# Patient Record
Sex: Female | Born: 1955 | Race: White | Hispanic: No | Marital: Married | Smoking: Former smoker
Health system: Southern US, Community
[De-identification: ages and names within clinical notes are randomized; demographics above are authoritative.]

## PROBLEM LIST (undated history)

## (undated) DIAGNOSIS — M1990 Unspecified osteoarthritis, unspecified site: Secondary | ICD-10-CM

## (undated) DIAGNOSIS — Z973 Presence of spectacles and contact lenses: Secondary | ICD-10-CM

## (undated) DIAGNOSIS — G43909 Migraine, unspecified, not intractable, without status migrainosus: Secondary | ICD-10-CM

## (undated) DIAGNOSIS — Z9889 Other specified postprocedural states: Secondary | ICD-10-CM

## (undated) DIAGNOSIS — M419 Scoliosis, unspecified: Secondary | ICD-10-CM

## (undated) DIAGNOSIS — R112 Nausea with vomiting, unspecified: Secondary | ICD-10-CM

## (undated) DIAGNOSIS — E041 Nontoxic single thyroid nodule: Secondary | ICD-10-CM

## (undated) HISTORY — PX: COLONOSCOPY: SHX174

---

## 1997-09-05 HISTORY — PX: AUGMENTATION MAMMAPLASTY: SUR837

## 2005-02-10 ENCOUNTER — Ambulatory Visit: Payer: Self-pay

## 2013-02-15 ENCOUNTER — Ambulatory Visit: Payer: Self-pay | Admitting: Internal Medicine

## 2016-07-07 ENCOUNTER — Encounter: Payer: Self-pay | Admitting: Obstetrics and Gynecology

## 2016-09-09 DIAGNOSIS — M542 Cervicalgia: Secondary | ICD-10-CM | POA: Diagnosis not present

## 2017-01-18 ENCOUNTER — Other Ambulatory Visit: Payer: Self-pay | Admitting: Obstetrics & Gynecology

## 2017-01-18 DIAGNOSIS — Z1231 Encounter for screening mammogram for malignant neoplasm of breast: Secondary | ICD-10-CM

## 2017-03-02 ENCOUNTER — Ambulatory Visit
Admission: RE | Admit: 2017-03-02 | Discharge: 2017-03-02 | Disposition: A | Discharge status: Home / Self Care | Payer: 59 | Source: Ambulatory Visit | Attending: Obstetrics & Gynecology | Admitting: Obstetrics & Gynecology

## 2017-03-02 ENCOUNTER — Other Ambulatory Visit: Payer: Self-pay | Admitting: Obstetrics & Gynecology

## 2017-03-02 ENCOUNTER — Encounter: Payer: Self-pay | Admitting: Radiology

## 2017-03-02 DIAGNOSIS — Z1211 Encounter for screening for malignant neoplasm of colon: Secondary | ICD-10-CM | POA: Diagnosis not present

## 2017-03-02 DIAGNOSIS — R5383 Other fatigue: Secondary | ICD-10-CM | POA: Diagnosis not present

## 2017-03-02 DIAGNOSIS — F331 Major depressive disorder, recurrent, moderate: Secondary | ICD-10-CM | POA: Insufficient documentation

## 2017-03-02 DIAGNOSIS — Z1231 Encounter for screening mammogram for malignant neoplasm of breast: Secondary | ICD-10-CM

## 2017-03-02 DIAGNOSIS — Z01419 Encounter for gynecological examination (general) (routine) without abnormal findings: Secondary | ICD-10-CM | POA: Diagnosis not present

## 2017-03-07 ENCOUNTER — Other Ambulatory Visit: Payer: Self-pay | Admitting: *Deleted

## 2017-03-07 ENCOUNTER — Inpatient Hospital Stay
Admission: RE | Admit: 2017-03-07 | Discharge: 2017-03-07 | Disposition: A | Discharge status: Home / Self Care | Payer: Self-pay | Source: Ambulatory Visit | Attending: *Deleted | Admitting: *Deleted

## 2017-03-07 DIAGNOSIS — Z9289 Personal history of other medical treatment: Secondary | ICD-10-CM

## 2017-07-07 DIAGNOSIS — M542 Cervicalgia: Secondary | ICD-10-CM | POA: Diagnosis not present

## 2017-07-20 DIAGNOSIS — Z0000 Encounter for general adult medical examination without abnormal findings: Secondary | ICD-10-CM | POA: Diagnosis not present

## 2017-07-20 DIAGNOSIS — F419 Anxiety disorder, unspecified: Secondary | ICD-10-CM | POA: Insufficient documentation

## 2017-07-20 DIAGNOSIS — E7800 Pure hypercholesterolemia, unspecified: Secondary | ICD-10-CM | POA: Diagnosis not present

## 2017-07-20 DIAGNOSIS — E079 Disorder of thyroid, unspecified: Secondary | ICD-10-CM | POA: Insufficient documentation

## 2017-07-20 DIAGNOSIS — R51 Headache: Secondary | ICD-10-CM

## 2017-07-20 DIAGNOSIS — E785 Hyperlipidemia, unspecified: Secondary | ICD-10-CM | POA: Insufficient documentation

## 2017-07-20 DIAGNOSIS — R519 Headache, unspecified: Secondary | ICD-10-CM | POA: Insufficient documentation

## 2017-09-08 DIAGNOSIS — M539 Dorsopathy, unspecified: Secondary | ICD-10-CM | POA: Diagnosis not present

## 2017-09-08 DIAGNOSIS — R51 Headache: Secondary | ICD-10-CM | POA: Diagnosis not present

## 2017-09-15 DIAGNOSIS — M5030 Other cervical disc degeneration, unspecified cervical region: Secondary | ICD-10-CM | POA: Diagnosis not present

## 2017-09-15 DIAGNOSIS — M5416 Radiculopathy, lumbar region: Secondary | ICD-10-CM | POA: Diagnosis not present

## 2017-09-15 DIAGNOSIS — M62830 Muscle spasm of back: Secondary | ICD-10-CM | POA: Diagnosis not present

## 2017-09-28 DIAGNOSIS — D225 Melanocytic nevi of trunk: Secondary | ICD-10-CM | POA: Diagnosis not present

## 2017-09-28 DIAGNOSIS — L570 Actinic keratosis: Secondary | ICD-10-CM | POA: Diagnosis not present

## 2017-10-13 DIAGNOSIS — E079 Disorder of thyroid, unspecified: Secondary | ICD-10-CM | POA: Diagnosis not present

## 2017-11-02 ENCOUNTER — Ambulatory Visit: Payer: 59 | Attending: Family Medicine | Admitting: Physical Therapy

## 2017-11-02 ENCOUNTER — Encounter: Payer: Self-pay | Admitting: Physical Therapy

## 2017-11-02 ENCOUNTER — Other Ambulatory Visit: Payer: Self-pay

## 2017-11-02 DIAGNOSIS — M2560 Stiffness of unspecified joint, not elsewhere classified: Secondary | ICD-10-CM | POA: Diagnosis not present

## 2017-11-02 DIAGNOSIS — M6281 Muscle weakness (generalized): Secondary | ICD-10-CM

## 2017-11-02 DIAGNOSIS — R293 Abnormal posture: Secondary | ICD-10-CM | POA: Diagnosis present

## 2017-11-02 DIAGNOSIS — M5442 Lumbago with sciatica, left side: Secondary | ICD-10-CM | POA: Diagnosis not present

## 2017-11-02 NOTE — Therapy (Addendum)
[place] [facility] [address] [place], [place], [address] Phone: [phone]   Fax:  [phone]  Physical Therapy Evaluation  Patient Details  Name: Kendra George MRN: [id number] Date of Birth: June 15, 1956 Referring Provider: Allene Dillon, NP   Encounter Date: 11/02/2017  PT End of Session - 11/23/17 0735    Visit Number  1    Number of Visits  8    Date for PT Re-Evaluation  12/28/17    PT Start Time  1110    PT Stop Time  1206    PT Time Calculation (min)  56 min    Activity Tolerance  Patient tolerated treatment well    Behavior During Therapy  [facility] for tasks assessed/performed       History reviewed. No pertinent past medical history.  Past Surgical History:  Procedure Laterality Date  . AUGMENTATION MAMMAPLASTY Bilateral [place] IN 2010    There were no vitals filed for this visit.   Subjective Assessment - 11/23/17 0732    Subjective  Pt. 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  Pt. reports L sided radicular involvement and c/o "tingling".      Pertinent History  pt. exercises at [employer] in [place].  Pt. will occasionally take a 1/2 pain pill.,      Limitations  Sitting;Lifting;Standing;Walking;Other (comment)    Patient Stated Goals  Decrease LBP/ increase core strengthening.      Currently in Pain?  Yes    Pain Score  2     Pain Location  Back    Pain Orientation  Left;Lower    Pain Descriptors / Indicators  Tingling         See HEP    PT Education - 11/23/17 0735    Education provided  Yes    Education Details  See HEP    Person(s) Educated  Patient    Methods  Explanation;Demonstration;Handout    Comprehension  Verbalized understanding;Returned demonstration          PT Long Term Goals - 11/23/17 0745      PT LONG TERM GOAL #1   Title  Pt. will increase FOTO  from 70 to 79 to improve pain-free mobility.      Baseline  FOTO baseline 70    Time  8    Period  Weeks    Status  New    Target Date  12/28/17      PT LONG TERM GOAL #2   Title  Pt. independent with HEP to increase core strengthening/ stability to WNL to promote increase progression with gym based ex. program.      Baseline  limited understanding of core muscle activation.      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      Baseline  pain in L LBP with sitting R trunk rotn.      Time  8    Period  Weeks    Status  New    Target Date  12/28/17         Plan - 11/23/17 [phone]  Clinical Impression Statement  Pt. is a pleasant and active 62 y/o female with >3 month h/o L low back pain.  Pt. 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  Pt. tends to sit at work with R trunk rotn. and presents with mild scoliosis/ increase R scapular height/ increase R paraspinal muscle height as compared to L side.  No LLD.  Mild soft tissue tightness on R low thoracic/lumbar musculature as compared to L.  Pt. present with good B UE/LE muscle strength but requires cuing to properly contract TrA musculature.  FOTO: baseline 70/ goal 79.  Pt. will benefit from skilled PT with HEP focus to decrease L low back pain with work-related tasks.      Clinical Presentation  Stable    Clinical Decision Making  Low    Rehab Potential  Good    PT Frequency  1x / week    PT Duration  8 weeks    PT Treatment/Interventions  ADLs/Self Care Home Management;Cryotherapy;Electrical Stimulation;Moist Heat;Therapeutic activities;Therapeutic exercise;Neuromuscular re-education;Patient/family education;Manual techniques;Dry needling    PT Next Visit Plan  Progress core stability ex.  program.         Patient will benefit from skilled therapeutic intervention in order to improve the following deficits and impairments:  Decreased mobility, Hypomobility, Improper body mechanics, Decreased range of motion, Pain, Postural dysfunction, Decreased strength  Visit Diagnosis: Acute left-sided low back pain with left-sided sciatica  Joint stiffness  Muscle weakness (generalized)  Abnormal posture     Problem List There are no active problems to display for this patient.  Pura Spice, PT, DPT # [phone] 11/23/2017, 7:48 AM  [place] [facility] [address] [place], [place], [address] Phone: [phone]   Fax:  [phone]  Name: DEYSY SCHABEL MRN: [id number] Date of Birth: 1955-10-09

## 2017-11-09 ENCOUNTER — Ambulatory Visit: Payer: 59 | Attending: Family Medicine | Admitting: Physical Therapy

## 2017-11-09 DIAGNOSIS — M5442 Lumbago with sciatica, left side: Secondary | ICD-10-CM | POA: Diagnosis not present

## 2017-11-09 DIAGNOSIS — M2560 Stiffness of unspecified joint, not elsewhere classified: Secondary | ICD-10-CM

## 2017-11-09 DIAGNOSIS — R293 Abnormal posture: Secondary | ICD-10-CM

## 2017-11-09 DIAGNOSIS — M6281 Muscle weakness (generalized): Secondary | ICD-10-CM | POA: Diagnosis present

## 2017-11-16 ENCOUNTER — Ambulatory Visit: Payer: 59 | Admitting: Physical Therapy

## 2017-11-17 NOTE — Therapy (Addendum)
[place] [facility] [address] [place], [place], [address] Phone: [phone]   Fax:  [phone]  Physical Therapy Treatment  Patient Details  Name: Kendra George MRN: [id number] Date of Birth: [birth year] Referring Provider: Allene Dillon, NP   Encounter Date: 11/09/2017  PT End of Session - 11/23/17 0753    Visit Number  2    Number of Visits  8    Date for PT Re-Evaluation  12/28/17    PT Start Time  0903    PT Stop Time  1001    PT Time Calculation (min)  58 min    Activity Tolerance  Patient tolerated treatment well    Behavior During Therapy  [facility] for tasks assessed/performed       History reviewed. No pertinent past medical history.  Past Surgical History:  Procedure Laterality Date  . AUGMENTATION MAMMAPLASTY Bilateral [place] 2010    There were no vitals filed for this visit.  Subjective Assessment - 11/23/17 0752    Subjective  Pt. reports good compliance with gym based ex. program.  No new complaints.      Pertinent History  pt. exercises at [employer] in [place].  Pt. will occasionally take a 1/2 pain pill.,      Limitations  Sitting;Lifting;Standing;Walking;Other (comment)    Patient Stated Goals  Decrease LBP/ increase core strengthening.      Currently in Pain?  No/denies        Therex.:  Reviewed core stability ex. Program.   Blue mat ex.: supine ball ex. (bridging/ knee to chest/ SLR/ prone plank/ sidelying plank)- core muscle activation.    Manual:  Supine LE/lumbar stretches 9 min., Prone STM to B low thoracic/lumbar paraspinals/ grade III central mobs. To T10-L4 region 1x20 sec.    PT Education - 11/23/17 0735    Education provided  Yes    Education Details  See HEP    Person(s) Educated  Patient    Methods  Explanation;Demonstration;Handout    Comprehension  Verbalized understanding;Returned demonstration          PT Long Term Goals - 11/23/17 0745       PT LONG TERM GOAL #1   Title  Pt. will increase FOTO from 70 to 79 to improve pain-free mobility.      Baseline  FOTO baseline 70    Time  8    Period  Weeks    Status  New    Target Date  12/28/17      PT LONG TERM GOAL #2   Title  Pt. independent with HEP to increase core strengthening/ stability to WNL to promote increase progression with gym based ex. program.      Baseline  limited understanding of core muscle activation.      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      Baseline  pain in L LBP with sitting R trunk rotn.      Time  8    Period  Weeks    Status  New    Target Date  12/28/17            Plan - 11/23/17  1017    Clinical Impression Statement  Pt. presents with good core stability with high level prone planking/ side planking and bridging ex. with ball.  No c/o pain with manual tx. to L low back region.  No c/o radicular symptoms.  PT reviewed core stability ex. program and instructs pt. to continue with consistent gym based ex. in a pain-free range.      Clinical Presentation  Stable    Clinical Decision Making  Low    Rehab Potential  Good    PT Frequency  1x / week    PT Duration  8 weeks    PT Treatment/Interventions  ADLs/Self Care Home Management;Cryotherapy;Electrical Stimulation;Moist Heat;Therapeutic activities;Therapeutic exercise;Neuromuscular re-education;Patient/family education;Manual techniques;Dry needling    PT Next Visit Plan  Progress core stability ex. program.         Patient will benefit from skilled therapeutic intervention in order to improve the following deficits and impairments:  Decreased mobility, Hypomobility, Improper body mechanics, Decreased range of motion, Pain, Postural dysfunction, Decreased strength  Visit Diagnosis: Acute left-sided low back pain with  left-sided sciatica  Joint stiffness  Abnormal posture  Muscle weakness (generalized)     Problem List There are no active problems to display for this patient.  Pura Spice, PT, DPT # [phone] 11/23/2017, 7:57 AM  [place] [facility] [address] [place], [place], [address] Phone: [phone]   Fax:  [phone]  Name: Kendra George MRN: [id number] Date of Birth: 13-Oct-1955

## 2017-11-23 ENCOUNTER — Encounter: Payer: Self-pay | Admitting: Physical Therapy

## 2017-11-23 ENCOUNTER — Ambulatory Visit: Payer: 59 | Admitting: Physical Therapy

## 2017-11-23 DIAGNOSIS — M6281 Muscle weakness (generalized): Secondary | ICD-10-CM

## 2017-11-23 DIAGNOSIS — M2560 Stiffness of unspecified joint, not elsewhere classified: Secondary | ICD-10-CM

## 2017-11-23 DIAGNOSIS — R293 Abnormal posture: Secondary | ICD-10-CM

## 2017-11-23 DIAGNOSIS — M5442 Lumbago with sciatica, left side: Secondary | ICD-10-CM | POA: Diagnosis not present

## 2017-11-23 NOTE — Addendum Note (Signed)
Addended by: Pura Spice on: 11/23/2017 07:51 AM   Modules accepted: Orders

## 2017-11-30 ENCOUNTER — Ambulatory Visit: Payer: 59 | Admitting: Physical Therapy

## 2017-12-01 NOTE — Therapy (Addendum)
[facility] [address] [place], [place], [address] Phone: [phone]   Fax:  [phone]  Physical Therapy Treatment  Patient Details  Name: Kendra George MRN: [id number] Date of Birth: 26-Sep-1955 Referring Provider: Allene Dillon, NP   Encounter Date: 11/23/2017  PT End of Session - 12/01/17 1321    Visit Number  3    Number of Visits  8    Date for PT Re-Evaluation  12/28/17    PT Start Time  0906    PT Stop Time  1001    PT Time Calculation (min)  55 min    Activity Tolerance  Patient tolerated treatment well    Behavior During Therapy  [facility] for tasks assessed/performed       History reviewed. No pertinent past medical history.  Past Surgical History:  Procedure Laterality Date  . AUGMENTATION MAMMAPLASTY Bilateral [place] IN 2010    There were no vitals filed for this visit.  Subjective Assessment - 12/01/17 1311    Subjective  Pt. reports no new complaints.  Pt. still c/o discomfort in low back.      Pertinent History  pt. exercises at [employer] in [place].  Pt. will occasionally take a 1/2 pain pill.,      Limitations  Sitting;Lifting;Standing;Walking;Other (comment)    Patient Stated Goals  Decrease LBP/ increase core strengthening.      Currently in Pain?  No/denies          Pt. reports no new complaints. Pt. still c/o discomfort in low back.       Manual:    Supine LE/lumbar stretches 12 min., Prone STM to B low thoracic/lumbar paraspinals/ grade III central mobs. To T10-L4 region 1x20 sec. Prone trigger point dry needling to lumbar paraspinal trigger points (4 needles)- 1 muscle fasciculations noted.  Followed by STM.  Decrease muscle tightness noted.          Pt. has moderate L paraspinal muscle tightness with trigger point noted. Pt. tolerates trigger point dry needling with no issues follow by STM and stretching. No ther.ex. during tx. session with manual  tx. focus.     PT Long Term Goals - 11/23/17 0745      PT LONG TERM GOAL #1   Title  Pt. will increase FOTO from 70 to 79 to improve pain-free mobility.      Baseline  FOTO baseline 70    Time  8    Period  Weeks    Status  New    Target Date  12/28/17      PT LONG TERM GOAL #2   Title  Pt. independent with HEP to increase core strengthening/ stability to WNL to promote increase progression with gym based ex. program.      Baseline  limited understanding of core muscle activation.      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      Baseline  pain in L LBP with sitting R trunk rotn.      Time  8    Period  Weeks    Status  New    Target Date  12/28/17  Plan - 12/01/17 1321    Clinical Impression Statement  Pt. has moderate L paraspinal muscle tightness    Clinical Presentation  Stable    Clinical Decision Making  Low    Rehab Potential  Good    PT Frequency  1x / week    PT Duration  8 weeks    PT Treatment/Interventions  ADLs/Self Care Home Management;Cryotherapy;Electrical Stimulation;Moist Heat;Therapeutic activities;Therapeutic exercise;Neuromuscular re-education;Patient/family education;Manual techniques;Dry needling    PT Next Visit Plan  Progress core stability ex. program.         Patient will benefit from skilled therapeutic intervention in order to improve the following deficits and impairments:  Decreased mobility, Hypomobility, Improper body mechanics, Decreased range of motion, Pain, Postural dysfunction, Decreased strength  Visit Diagnosis: Acute left-sided low back pain with left-sided sciatica  Joint stiffness  Abnormal posture  Muscle weakness (generalized)     Problem List There are no active problems to display for this patient.  Pura Spice, PT, DPT #  [phone] 12/01/2017, 1:30 PM  [place] [facility] [address] [place], [place], [address] Phone: [phone]   Fax:  [phone]  Name: Kendra George MRN: [id number] Date of Birth: 07/10/1956

## 2017-12-07 ENCOUNTER — Ambulatory Visit: Payer: 59 | Attending: Family Medicine | Admitting: Physical Therapy

## 2017-12-07 ENCOUNTER — Encounter: Payer: Self-pay | Admitting: Physical Therapy

## 2017-12-07 DIAGNOSIS — M5442 Lumbago with sciatica, left side: Secondary | ICD-10-CM | POA: Diagnosis present

## 2017-12-07 DIAGNOSIS — M2560 Stiffness of unspecified joint, not elsewhere classified: Secondary | ICD-10-CM | POA: Insufficient documentation

## 2017-12-07 DIAGNOSIS — R293 Abnormal posture: Secondary | ICD-10-CM | POA: Insufficient documentation

## 2017-12-07 DIAGNOSIS — M6281 Muscle weakness (generalized): Secondary | ICD-10-CM | POA: Diagnosis present

## 2017-12-15 NOTE — Therapy (Signed)
[facility] [address] [place], [place], [address] Phone: [phone]   Fax:  [phone]  Physical Therapy Treatment  Patient Details  Name: Kendra George MRN: [id number] Date of Birth: 21-Dec-1955 Referring Provider: Allene Dillon, NP   Encounter Date: 12/07/2017  PT End of Session - 12/15/17 1502    Visit Number  4    Number of Visits  8    Date for PT Re-Evaluation  12/28/17    PT Start Time  0906    PT Stop Time  1001    PT Time Calculation (min)  55 min    Activity Tolerance  Patient tolerated treatment well    Behavior During Therapy  [facility] for tasks assessed/performed       History reviewed. No pertinent past medical history.  Past Surgical History:  Procedure Laterality Date  . AUGMENTATION MAMMAPLASTY Bilateral [place] IN 2010    There were no vitals filed for this visit.  Subjective Assessment - 12/15/17 1501    Subjective  Pt. reports she is doing well but low back is feeling stiff, esp. with work-related tasks.     Pertinent History  pt. exercises at [employer] in [place].  Pt. will occasionally take a 1/2 pain pill.,      Limitations  Sitting;Lifting;Standing;Walking;Other (comment)    Patient Stated Goals  Decrease LBP/ increase core strengthening.      Currently in Pain?  No/denies          Treatment:   There.ex.:  Reviewed home/ gym based ex. Program.  Manual tx.: Supine/ prone lumbar and LE stretches (19 min.). Prone STM to B lumbar paraspinals (several trigger points noted). Prone trigger point dry needling to lumbar paraspinal trigger points (4 needles)- 2 small muscle fasciculations noted.      PT Long Term Goals - 11/23/17 0745      PT LONG TERM GOAL #1   Title  Pt. will increase FOTO from 70 to 79 to improve pain-free mobility.      Baseline  FOTO baseline 70    Time  8    Period  Weeks    Status  New    Target Date  12/28/17      PT LONG TERM GOAL  #2   Title  Pt. independent with HEP to increase core strengthening/ stability to WNL to promote increase progression with gym based ex. program.      Baseline  limited understanding of core muscle activation.      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      Baseline  pain in L LBP with sitting R trunk rotn.      Time  8    Period  Weeks    Status  New    Target Date  12/28/17            Plan - 12/15/17 1504    Clinical Impression Statement  Several trigger points noted in L lumbar paraspinals during prone soft tissue assessment.  Low thoracic/lumbar hypomoblity remains during manaul tx.  PT reviewed home/ gym based ex. program.  Good tx. tolerance with dry needling to lumbar parapinal trigger  points.      Clinical Presentation  Stable    Clinical Decision Making  Low    Rehab Potential  Good    PT Frequency  1x / week    PT Duration  8 weeks    PT Treatment/Interventions  ADLs/Self Care Home Management;Cryotherapy;Electrical Stimulation;Moist Heat;Therapeutic activities;Therapeutic exercise;Neuromuscular re-education;Patient/family education;Manual techniques;Dry needling    PT Next Visit Plan  Progress core stability ex. program.  1 more PT tx. session scheduled.  Pt. instructed to call and cancel if no issues prior to appt.         Patient will benefit from skilled therapeutic intervention in order to improve the following deficits and impairments:  Decreased mobility, Hypomobility, Improper body mechanics, Decreased range of motion, Pain, Postural dysfunction, Decreased strength  Visit Diagnosis: Acute left-sided low back pain with left-sided sciatica  Joint stiffness  Abnormal posture  Muscle weakness (generalized)     Problem List There are no active problems to display for this patient.  Pura Spice, PT, DPT # [phone] 12/15/2017, 3:12 PM  [place] [facility] [address] [place], [place], [address] Phone: [phone]   Fax:  [phone]  Name: Kendra George MRN: [id number] Date of Birth: Aug 09, 1956

## 2017-12-29 ENCOUNTER — Ambulatory Visit: Payer: 59 | Admitting: Physical Therapy

## 2018-02-07 ENCOUNTER — Other Ambulatory Visit: Payer: Self-pay | Admitting: Obstetrics & Gynecology

## 2018-02-07 DIAGNOSIS — Z1231 Encounter for screening mammogram for malignant neoplasm of breast: Secondary | ICD-10-CM

## 2018-02-09 DIAGNOSIS — E7800 Pure hypercholesterolemia, unspecified: Secondary | ICD-10-CM | POA: Diagnosis not present

## 2018-02-09 DIAGNOSIS — R51 Headache: Secondary | ICD-10-CM | POA: Diagnosis not present

## 2018-02-09 DIAGNOSIS — E079 Disorder of thyroid, unspecified: Secondary | ICD-10-CM | POA: Diagnosis not present

## 2018-02-23 DIAGNOSIS — E7800 Pure hypercholesterolemia, unspecified: Secondary | ICD-10-CM | POA: Diagnosis not present

## 2018-02-23 DIAGNOSIS — Z79899 Other long term (current) drug therapy: Secondary | ICD-10-CM | POA: Diagnosis not present

## 2018-03-15 ENCOUNTER — Ambulatory Visit
Admission: RE | Admit: 2018-03-15 | Discharge: 2018-03-15 | Disposition: A | Discharge status: Home / Self Care | Payer: 59 | Source: Ambulatory Visit | Attending: Obstetrics & Gynecology | Admitting: Obstetrics & Gynecology

## 2018-03-15 DIAGNOSIS — Z1231 Encounter for screening mammogram for malignant neoplasm of breast: Secondary | ICD-10-CM | POA: Diagnosis not present

## 2018-07-06 DIAGNOSIS — R030 Elevated blood-pressure reading, without diagnosis of hypertension: Secondary | ICD-10-CM | POA: Diagnosis not present

## 2018-07-06 DIAGNOSIS — M4722 Other spondylosis with radiculopathy, cervical region: Secondary | ICD-10-CM | POA: Diagnosis not present

## 2018-07-06 DIAGNOSIS — Z681 Body mass index (BMI) 19 or less, adult: Secondary | ICD-10-CM | POA: Diagnosis not present

## 2018-08-23 DIAGNOSIS — E7800 Pure hypercholesterolemia, unspecified: Secondary | ICD-10-CM | POA: Diagnosis not present

## 2018-08-23 DIAGNOSIS — R0781 Pleurodynia: Secondary | ICD-10-CM | POA: Diagnosis not present

## 2018-08-23 DIAGNOSIS — S2242XA Multiple fractures of ribs, left side, initial encounter for closed fracture: Secondary | ICD-10-CM | POA: Diagnosis not present

## 2018-08-23 DIAGNOSIS — Z0000 Encounter for general adult medical examination without abnormal findings: Secondary | ICD-10-CM | POA: Diagnosis not present

## 2018-08-23 DIAGNOSIS — Z79899 Other long term (current) drug therapy: Secondary | ICD-10-CM | POA: Diagnosis not present

## 2018-08-24 ENCOUNTER — Other Ambulatory Visit: Payer: Self-pay | Admitting: Internal Medicine

## 2018-08-24 DIAGNOSIS — R55 Syncope and collapse: Secondary | ICD-10-CM

## 2018-09-17 ENCOUNTER — Telehealth: Payer: Self-pay | Admitting: Gastroenterology

## 2018-09-17 NOTE — Telephone Encounter (Signed)
Patient called and left message on machine returning Michelle's call to schedule a colonoscopy with Dr Allen Norris.She would like to schedule on Thursday or Friday(prefers Friday).Please call & l/m.

## 2018-09-18 NOTE — Telephone Encounter (Signed)
Left message for pt to call back to schedule her colonoscopy.  Dr. Allen Norris has availability on Fri Jan 24th and Fri Jan 31st at [facility].  When pt calls back I will see if she would like to schedule on one of these dates.  Thanks [employer]

## 2018-09-21 ENCOUNTER — Other Ambulatory Visit: Payer: Self-pay

## 2018-09-21 DIAGNOSIS — Z1211 Encounter for screening for malignant neoplasm of colon: Secondary | ICD-10-CM

## 2018-09-21 MED ORDER — NA SULFATE-K SULFATE-MG SULF 17.5-3.13-1.6 GM/177ML PO SOLN: 1.0000 | ORAL | 0 refills | Status: AC

## 2018-09-28 DIAGNOSIS — D2261 Melanocytic nevi of right upper limb, including shoulder: Secondary | ICD-10-CM | POA: Diagnosis not present

## 2018-09-28 DIAGNOSIS — D2262 Melanocytic nevi of left upper limb, including shoulder: Secondary | ICD-10-CM | POA: Diagnosis not present

## 2018-09-28 DIAGNOSIS — D225 Melanocytic nevi of trunk: Secondary | ICD-10-CM | POA: Diagnosis not present

## 2018-10-11 DIAGNOSIS — M8588 Other specified disorders of bone density and structure, other site: Secondary | ICD-10-CM | POA: Diagnosis not present

## 2018-10-22 ENCOUNTER — Encounter: Payer: Self-pay | Admitting: *Deleted

## 2018-10-22 ENCOUNTER — Other Ambulatory Visit: Payer: Self-pay

## 2018-10-30 NOTE — Discharge Instructions (Signed)
General Anesthesia, Adult, Care After  This sheet gives you information about how to care for yourself after your procedure. Your health care provider may also give you more specific instructions. If you have problems or questions, contact your health care provider.  What can I expect after the procedure?  After the procedure, the following side effects are common:  Pain or discomfort at the IV site.  Nausea.  Vomiting.  Sore throat.  Trouble concentrating.  Feeling cold or chills.  Weak or tired.  Sleepiness and fatigue.  Soreness and body aches. These side effects can affect parts of the body that were not involved in surgery.  Follow these instructions at home:    For at least 24 hours after the procedure:  Have a responsible adult stay with you. It is important to have someone help care for you until you are awake and alert.  Rest as needed.  Do not:  Participate in activities in which you could fall or become injured.  Drive.  Use heavy machinery.  Drink alcohol.  Take sleeping pills or medicines that cause drowsiness.  Make important decisions or sign legal documents.  Take care of children on your own.  Eating and drinking  Follow any instructions from your health care provider about eating or drinking restrictions.  When you feel hungry, start by eating small amounts of foods that are soft and easy to digest (bland), such as toast. Gradually return to your regular diet.  Drink enough fluid to keep your urine pale yellow.  If you vomit, rehydrate by drinking water, juice, or clear broth.  General instructions  If you have sleep apnea, surgery and certain medicines can increase your risk for breathing problems. Follow instructions from your health care provider about wearing your sleep device:  Anytime you are sleeping, including during daytime naps.  While taking prescription pain medicines, sleeping medicines, or medicines that make you drowsy.  Return to your normal activities as told by your health care  provider. Ask your health care provider what activities are safe for you.  Take over-the-counter and prescription medicines only as told by your health care provider.  If you smoke, do not smoke without supervision.  Keep all follow-up visits as told by your health care provider. This is important.  Contact a health care provider if:  You have nausea or vomiting that does not get better with medicine.  You cannot eat or drink without vomiting.  You have pain that does not get better with medicine.  You are unable to pass urine.  You develop a skin rash.  You have a fever.  You have redness around your IV site that gets worse.  Get help right away if:  You have difficulty breathing.  You have chest pain.  You have blood in your urine or stool, or you vomit blood.  Summary  After the procedure, it is common to have a sore throat or nausea. It is also common to feel tired.  Have a responsible adult stay with you for the first 24 hours after general anesthesia. It is important to have someone help care for you until you are awake and alert.  When you feel hungry, start by eating small amounts of foods that are soft and easy to digest (bland), such as toast. Gradually return to your regular diet.  Drink enough fluid to keep your urine pale yellow.  Return to your normal activities as told by your health care provider. Ask your health care   provider what activities are safe for you.  This information is not intended to replace advice given to you by your health care provider. Make sure you discuss any questions you have with your health care provider.  Document Released: 11/28/2000 Document Revised: 04/07/2017 Document Reviewed: 04/07/2017  Elsevier Interactive Patient Education  2019 [employer]

## 2018-11-02 ENCOUNTER — Ambulatory Visit
Admission: RE | Admit: 2018-11-02 | Discharge: 2018-11-02 | Disposition: A | Discharge status: Home / Self Care | Payer: 59 | Attending: Gastroenterology | Admitting: Gastroenterology

## 2018-11-02 ENCOUNTER — Ambulatory Visit: Payer: 59 | Admitting: Anesthesiology

## 2018-11-02 ENCOUNTER — Encounter
Admission: RE | Disposition: A | Discharge status: Home / Self Care | Payer: Self-pay | Source: Home / Self Care | Attending: Gastroenterology

## 2018-11-02 DIAGNOSIS — Z79899 Other long term (current) drug therapy: Secondary | ICD-10-CM | POA: Insufficient documentation

## 2018-11-02 DIAGNOSIS — Z87891 Personal history of nicotine dependence: Secondary | ICD-10-CM | POA: Insufficient documentation

## 2018-11-02 DIAGNOSIS — E041 Nontoxic single thyroid nodule: Secondary | ICD-10-CM | POA: Insufficient documentation

## 2018-11-02 DIAGNOSIS — Z79890 Hormone replacement therapy (postmenopausal): Secondary | ICD-10-CM | POA: Insufficient documentation

## 2018-11-02 DIAGNOSIS — K640 First degree hemorrhoids: Secondary | ICD-10-CM | POA: Insufficient documentation

## 2018-11-02 DIAGNOSIS — M1990 Unspecified osteoarthritis, unspecified site: Secondary | ICD-10-CM | POA: Insufficient documentation

## 2018-11-02 DIAGNOSIS — Z1211 Encounter for screening for malignant neoplasm of colon: Secondary | ICD-10-CM | POA: Diagnosis not present

## 2018-11-02 DIAGNOSIS — Z7951 Long term (current) use of inhaled steroids: Secondary | ICD-10-CM | POA: Insufficient documentation

## 2018-11-02 HISTORY — DX: Migraine, unspecified, not intractable, without status migrainosus: G43.909

## 2018-11-02 HISTORY — DX: Scoliosis, unspecified: M41.9

## 2018-11-02 HISTORY — DX: Nausea with vomiting, unspecified: Z98.890

## 2018-11-02 HISTORY — PX: COLONOSCOPY WITH PROPOFOL: SHX5780

## 2018-11-02 HISTORY — DX: Unspecified osteoarthritis, unspecified site: M19.90

## 2018-11-02 HISTORY — DX: Presence of spectacles and contact lenses: Z97.3

## 2018-11-02 HISTORY — DX: Nontoxic single thyroid nodule: E04.1

## 2018-11-02 HISTORY — DX: Nausea with vomiting, unspecified: R11.2

## 2018-11-02 SURGERY — COLONOSCOPY WITH PROPOFOL
Anesthesia: General | Site: Rectum

## 2018-11-02 MED ORDER — LIDOCAINE HCL (CARDIAC) PF 100 MG/5ML IV SOSY
PREFILLED_SYRINGE | INTRAVENOUS | Status: DC | PRN
  Administered 2018-11-02: 40 mg via INTRAVENOUS

## 2018-11-02 MED ORDER — STERILE WATER FOR IRRIGATION IR SOLN
Status: DC | PRN
  Administered 2018-11-02: 09:00:00

## 2018-11-02 MED ORDER — LACTATED RINGERS IV SOLN
INTRAVENOUS | Status: DC | PRN
  Administered 2018-11-02: 09:00:00 via INTRAVENOUS

## 2018-11-02 MED ORDER — PROPOFOL 10 MG/ML IV BOLUS
INTRAVENOUS | Status: DC | PRN
  Administered 2018-11-02: 20 mg via INTRAVENOUS
  Administered 2018-11-02 (×2): 30 mg via INTRAVENOUS
  Administered 2018-11-02: 100 mg via INTRAVENOUS
  Administered 2018-11-02: 20 mg via INTRAVENOUS
  Administered 2018-11-02: 30 mg via INTRAVENOUS
  Administered 2018-11-02: 20 mg via INTRAVENOUS

## 2018-11-02 SURGICAL SUPPLY — 5 items
CANISTER SUCT 1200ML W/VALVE (MISCELLANEOUS) ×3 IMPLANT
GOWN CVR UNV OPN BCK APRN NK (MISCELLANEOUS) ×2 IMPLANT
GOWN ISOL THUMB LOOP REG UNIV (MISCELLANEOUS) ×4
KIT ENDO PROCEDURE OLY (KITS) ×3 IMPLANT
WATER STERILE IRR 250ML POUR (IV SOLUTION) ×3 IMPLANT

## 2018-11-02 NOTE — Anesthesia Postprocedure Evaluation (Signed)
Anesthesia Post Note  Patient: Kendra George  Procedure(s) Performed: COLONOSCOPY WITH PROPOFOL (N/A Rectum)  Patient location during evaluation: PACU Anesthesia Type: General Level of consciousness: awake and alert Pain management: pain level controlled Vital Signs Assessment: post-procedure vital signs reviewed and stable Respiratory status: spontaneous breathing, nonlabored ventilation, respiratory function stable and patient connected to nasal cannula oxygen Cardiovascular status: blood pressure returned to baseline and stable Postop Assessment: no apparent nausea or vomiting Anesthetic complications: no    Lawren Sexson C

## 2018-11-02 NOTE — Anesthesia Procedure Notes (Signed)
Procedure Name: MAC Date/Time: 11/02/2018 9:16 AM Performed by: Janna Arch, CRNA Pre-anesthesia Checklist: Patient identified, Emergency Drugs available, Suction available, Timeout performed and Patient being monitored Patient Re-evaluated:Patient Re-evaluated prior to induction Oxygen Delivery Method: Nasal cannula Placement Confirmation: positive ETCO2

## 2018-11-02 NOTE — Op Note (Signed)
[facility] Gastroenterology Patient Name: Kendra George Procedure Date: 11/02/2018 9:12 AM MRN: [id number] Account #: [id number] Date of Birth: 13-Feb-1956 Admit Type: Outpatient Age: 63 Room: [facility] OR ROOM 01 Gender: Female Note Status: Finalized Procedure:            Colonoscopy Indications:          Screening for colorectal malignant neoplasm Providers:            [name] MD, MD Referring MD:         Leonie Douglas. Doy Hutching, MD (Referring MD) Medicines:            Propofol per Anesthesia Complications:        No immediate complications. Procedure:            Pre-Anesthesia Assessment:                       - Prior to the procedure, a History and Physical was                        performed, and patient medications and allergies were                        reviewed. The patient's tolerance of previous                        anesthesia was also reviewed. The risks and benefits of                        the procedure and the sedation options and risks were                        discussed with the patient. All questions were                        answered, and informed consent was obtained. Prior                        Anticoagulants: The patient has taken no previous                        anticoagulant or antiplatelet agents. ASA Grade                        Assessment: II - A patient with mild systemic disease.                        After reviewing the risks and benefits, the patient was                        deemed in satisfactory condition to undergo the                        procedure.                       After obtaining informed consent, the colonoscope was                        passed under direct vision. Throughout the procedure,  the patient's blood pressure, pulse, and oxygen                        saturations were monitored continuously. The was                        introduced through the anus and advanced to the the                   cecum, identified by appendiceal orifice and ileocecal                        valve. The colonoscopy was performed without                        difficulty. The patient tolerated the procedure well.                        The quality of the bowel preparation was excellent. Findings:      The perianal and digital rectal examinations were normal.      Non-bleeding internal hemorrhoids were found during retroflexion. The       hemorrhoids were Grade I (internal hemorrhoids that do not prolapse). Impression:           - Non-bleeding internal hemorrhoids.                       - No specimens collected. Recommendation:       - Discharge patient to home.                       - Resume previous diet.                       - Continue present medications. Procedure Code(s):    --- Professional ---                       [phone], Colonoscopy, flexible; diagnostic, including                        collection of specimen(s) by brushing or washing, when                        performed (separate procedure) Diagnosis Code(s):    --- Professional ---                       Z12.11, Encounter for screening for malignant neoplasm                        of colon CPT copyright 2018 [employer]. All rights reserved. The codes documented in this report are preliminary and upon coder review may  be revised to meet current compliance requirements. [name] MD, MD 11/02/2018 9:32:13 AM This report has been signed electronically. Number of Addenda: 0 Note Initiated On: 11/02/2018 9:12 AM Scope Withdrawal Time: 0 hours 6 minutes 46 seconds  Total Procedure Duration: 0 hours 11 minutes 5 seconds       [facility]

## 2018-11-02 NOTE — Transfer of Care (Signed)
Immediate Anesthesia Transfer of Care Note  Patient: Kendra George  Procedure(s) Performed: COLONOSCOPY WITH PROPOFOL (N/A Rectum)  Patient Location: PACU  Anesthesia Type: General  Level of Consciousness: awake, alert  and patient cooperative  Airway and Oxygen Therapy: Patient Spontanous Breathing and Patient connected to supplemental oxygen  Post-op Assessment: Post-op Vital signs reviewed, Patient's Cardiovascular Status Stable, Respiratory Function Stable, Patent Airway and No signs of Nausea or vomiting  Post-op Vital Signs: Reviewed and stable  Complications: No apparent anesthesia complications

## 2018-11-02 NOTE — Anesthesia Preprocedure Evaluation (Signed)
Anesthesia Evaluation  Patient identified by MRN, date of birth, ID band Patient awake    Reviewed: Allergy & Precautions, NPO status , Patient's Chart, lab work & pertinent test results  History of Anesthesia Complications (+) PONV  Airway Mallampati: II  TM Distance: >3 FB Neck ROM: Full    Dental no notable dental hx.    Pulmonary neg pulmonary ROS, former smoker,    Pulmonary exam normal breath sounds clear to auscultation       Cardiovascular negative cardio ROS Normal cardiovascular exam Rhythm:Regular Rate:Normal     Neuro/Psych  Headaches, Anxiety Depression negative psych ROS   GI/Hepatic negative GI ROS, Neg liver ROS,   Endo/Other  Thyroid nodule  Renal/GU negative Renal ROS  negative genitourinary   Musculoskeletal  (+) Arthritis ,   Abdominal   Peds negative pediatric ROS (+)  Hematology negative hematology ROS (+)   Anesthesia Other Findings   Reproductive/Obstetrics negative OB ROS                             Anesthesia Physical Anesthesia Plan  ASA: II  Anesthesia Plan: General   Post-op Pain Management:    Induction: Intravenous  PONV Risk Score and Plan:   Airway Management Planned: Natural Airway  Additional Equipment:   Intra-op Plan:   Post-operative Plan: Extubation in OR  Informed Consent: I have reviewed the patients History and Physical, chart, labs and discussed the procedure including the risks, benefits and alternatives for the proposed anesthesia with the patient or authorized representative who has indicated his/her understanding and acceptance.     Dental advisory given  Plan Discussed with: CRNA  Anesthesia Plan Comments:         Anesthesia Quick Evaluation

## 2018-11-02 NOTE — H&P (Signed)
[name], MD [name] [name], [name] [address] Phone: [phone] Fax : [phone]  Primary Care Physician:  Idelle Crouch, MD Primary Gastroenterologist:  Dr. Allen Norris  Pre-Procedure History & Physical: HPI:  Kendra George is a 63 y.o. female is here for a screening colonoscopy.   Past Medical History:  Diagnosis Date  . Arthritis    thumbs, neck  . Migraine headache    occasional.  More often stress and tension headaches  . PONV (postoperative nausea and vomiting)    nausea  . Scoliosis of lumbar spine   . Thyroid nodule   . Wears contact lenses     Past Surgical History:  Procedure Laterality Date  . AUGMENTATION MAMMAPLASTY Bilateral [place] 2010  . COLONOSCOPY      Prior to Admission medications   Medication Sig Start Date End Date Taking? Authorizing Provider  COLLAGEN PO Take by mouth daily.   Yes [provider]  escitalopram (LEXAPRO) 20 MG tablet  09/13/18  Yes [provider]  estradiol (ESTRACE) 0.1 MG/GM vaginal cream Place vaginally. 07/20/18  Yes [provider]  fluticasone (FLONASE) 50 MCG/ACT nasal spray SHAKE LIQUID AND USE 2 SPRAYS IN EACH NOSTRIL EVERY NIGHT 03/06/18  Yes [provider]  ALPRAZolam (XANAX) 0.25 MG tablet Take by mouth. 01/15/18   [provider]  butalbital-acetaminophen-caffeine (FIORICET, ESGIC) 50-325-40 MG tablet TAKE 1 TABLET BY MOUTH EVERY 4 HOURS AS NEEDED FOR PAIN 02/15/18   [provider]  HYDROcodone-acetaminophen (NORCO) 10-325 MG tablet TK 1/2 TO 1 T PO Q 8 H PRF PAIN 07/06/18   [provider]  Na Sulfate-K Sulfate-Mg Sulf (SUPREP BOWEL PREP KIT) 17.5-3.13-1.6 GM/177ML SOLN Take 1 kit by mouth as directed. 09/21/18   [name], MD    Allergies as of 09/21/2018 - Review Complete 12/07/2017  Allergen Reaction Noted  . Ampicillin Hives 12/26/2014  . Erythromycin  07/22/2016  . Statins  07/20/2017     Family History  Problem Relation Age of Onset  . Breast cancer Neg Hx     Social History   Socioeconomic History  . Marital status: Married    Spouse name: Not on file  . Number of children: Not on file  . Years of education: Not on file  . Highest education level: Not on file  Occupational History  . Not on file  Social Needs  . Financial resource strain: Not on file  . Food insecurity:    Worry: Not on file    Inability: Not on file  . Transportation needs:    Medical: Not on file    Non-medical: Not on file  Tobacco Use  . Smoking status: Former Research scientist (life sciences)  . Smokeless tobacco: Never Used  . Tobacco comment: socially in college  Substance and Sexual Activity  . Alcohol use: Yes    Comment: may drik 1x/month  . Drug use: Not on file  . Sexual activity: Not on file  Lifestyle  . Physical activity:    Days per week: Not on file    Minutes per session: Not on file  . Stress: Not on file  Relationships  . Social connections:    Talks on phone: Not on file    Gets together: Not on file    Attends religious service: Not on file    Active member of club or organization: Not on file    Attends meetings of clubs or organizations:  Not on file    Relationship status: Not on file  . Intimate partner violence:    Fear of current or ex partner: Not on file    Emotionally abused: Not on file    Physically abused: Not on file    Forced sexual activity: Not on file  Other Topics Concern  . Not on file  Social History Narrative  . Not on file    Review of Systems: See HPI, otherwise negative ROS  Physical Exam: Ht _0  (1.702 m)   Wt 59 kg   BMI 20.36 kg/m  General:   Alert,  pleasant and cooperative in NAD Head:  Normocephalic and atraumatic. Neck:  Supple; no masses or thyromegaly. Lungs:  Clear throughout to auscultation.    Heart:  Regular rate and rhythm. Abdomen:  Soft, nontender and nondistended. Normal bowel sounds, without guarding, and without  rebound.   Neurologic:  Alert and  oriented x4;  grossly normal neurologically.  Impression/Plan: Kendra George is now here to undergo a screening colonoscopy.  Risks, benefits, and alternatives regarding colonoscopy have been reviewed with the patient.  Questions have been answered.  All parties agreeable.

## 2019-02-14 ENCOUNTER — Other Ambulatory Visit: Payer: Self-pay | Admitting: Internal Medicine

## 2019-02-14 DIAGNOSIS — Z1231 Encounter for screening mammogram for malignant neoplasm of breast: Secondary | ICD-10-CM

## 2019-03-29 ENCOUNTER — Ambulatory Visit
Admission: RE | Admit: 2019-03-29 | Discharge: 2019-03-29 | Disposition: A | Discharge status: Home / Self Care | Payer: 59 | Source: Ambulatory Visit | Attending: Internal Medicine | Admitting: Internal Medicine

## 2019-03-29 DIAGNOSIS — Z1231 Encounter for screening mammogram for malignant neoplasm of breast: Secondary | ICD-10-CM

## 2019-11-29 ENCOUNTER — Other Ambulatory Visit: Payer: Self-pay | Admitting: Obstetrics & Gynecology

## 2019-11-29 DIAGNOSIS — Z1231 Encounter for screening mammogram for malignant neoplasm of breast: Secondary | ICD-10-CM

## 2020-04-02 ENCOUNTER — Inpatient Hospital Stay: Admission: RE | Admit: 2020-04-02 | Payer: 59 | Source: Ambulatory Visit

## 2020-05-01 ENCOUNTER — Ambulatory Visit
Admission: RE | Admit: 2020-05-01 | Discharge: 2020-05-01 | Disposition: A | Discharge status: Home / Self Care | Payer: 59 | Source: Ambulatory Visit | Attending: Obstetrics & Gynecology | Admitting: Obstetrics & Gynecology

## 2020-05-01 ENCOUNTER — Other Ambulatory Visit: Payer: Self-pay

## 2020-05-01 DIAGNOSIS — Z1231 Encounter for screening mammogram for malignant neoplasm of breast: Secondary | ICD-10-CM | POA: Diagnosis not present

## 2021-02-18 ENCOUNTER — Other Ambulatory Visit: Payer: Self-pay | Admitting: Internal Medicine

## 2021-02-18 DIAGNOSIS — Z1231 Encounter for screening mammogram for malignant neoplasm of breast: Secondary | ICD-10-CM

## 2021-02-18 DIAGNOSIS — N289 Disorder of kidney and ureter, unspecified: Secondary | ICD-10-CM

## 2021-03-25 ENCOUNTER — Ambulatory Visit: Payer: BC Managed Care – PPO | Attending: Internal Medicine

## 2021-06-10 ENCOUNTER — Other Ambulatory Visit: Payer: Self-pay

## 2021-06-10 ENCOUNTER — Ambulatory Visit
Admission: RE | Admit: 2021-06-10 | Discharge: 2021-06-10 | Disposition: A | Discharge status: Home / Self Care | Payer: BC Managed Care – PPO | Source: Ambulatory Visit | Attending: Internal Medicine | Admitting: Internal Medicine

## 2021-06-10 DIAGNOSIS — Z1231 Encounter for screening mammogram for malignant neoplasm of breast: Secondary | ICD-10-CM | POA: Insufficient documentation

## 2021-06-17 ENCOUNTER — Ambulatory Visit: Payer: BC Managed Care – PPO

## 2021-06-17 ENCOUNTER — Other Ambulatory Visit: Payer: Self-pay

## 2022-05-27 ENCOUNTER — Other Ambulatory Visit: Payer: Self-pay | Admitting: Internal Medicine

## 2022-05-27 DIAGNOSIS — Z1231 Encounter for screening mammogram for malignant neoplasm of breast: Secondary | ICD-10-CM

## 2022-06-16 ENCOUNTER — Ambulatory Visit
Admission: RE | Admit: 2022-06-16 | Discharge: 2022-06-16 | Disposition: A | Discharge status: Home / Self Care | Payer: BC Managed Care – PPO | Source: Ambulatory Visit | Attending: Internal Medicine | Admitting: Internal Medicine

## 2022-06-16 DIAGNOSIS — Z1231 Encounter for screening mammogram for malignant neoplasm of breast: Secondary | ICD-10-CM | POA: Diagnosis present

## 2022-07-03 IMAGING — MG DIGITAL SCREENING BREAST BILAT IMPLANT W/ TOMO W/ CAD
9 of 14 series · 9 of 34 positions shown · non-contrast
Comparison: Previous exam(s).

CLINICAL DATA: Screening.

EXAM:
DIGITAL SCREENING BILATERAL MAMMOGRAM WITH IMPLANTS, CAD AND TOMO
The patient has retropectoral implants. Standard and implant
displaced views were performed.

[L CC]
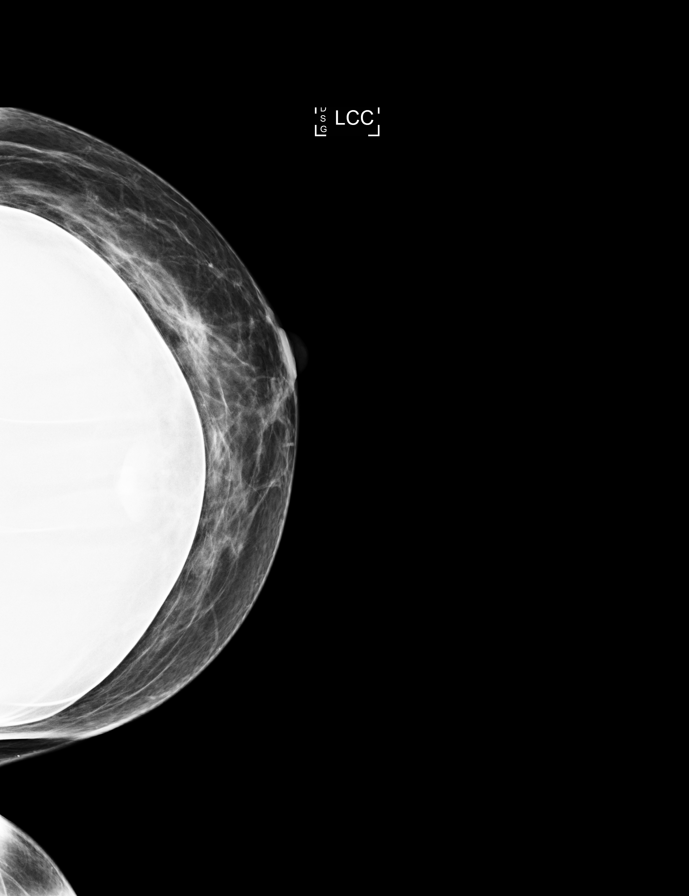

[R CC]
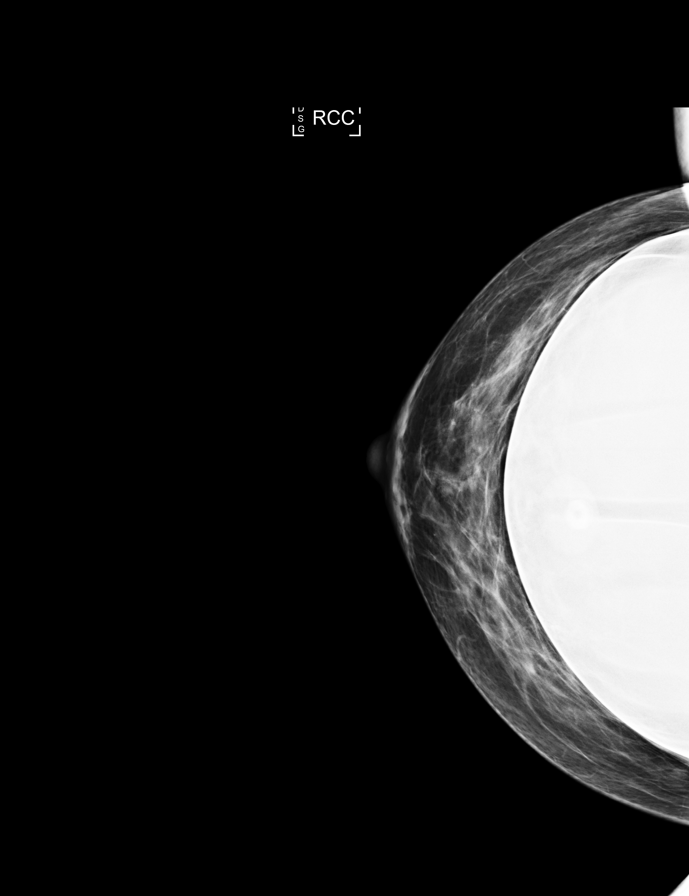

[L MLO]
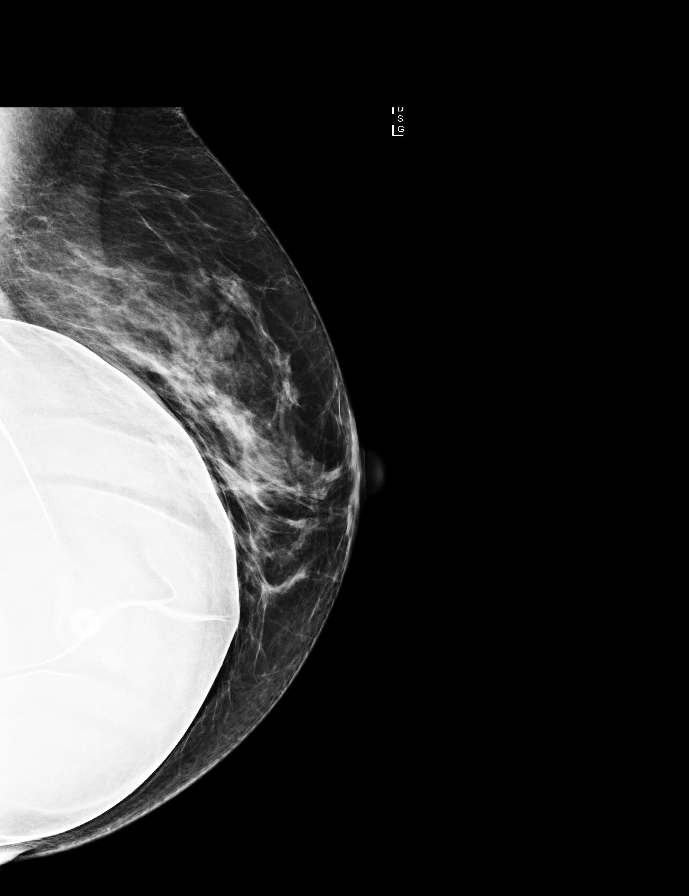

[R MLO]
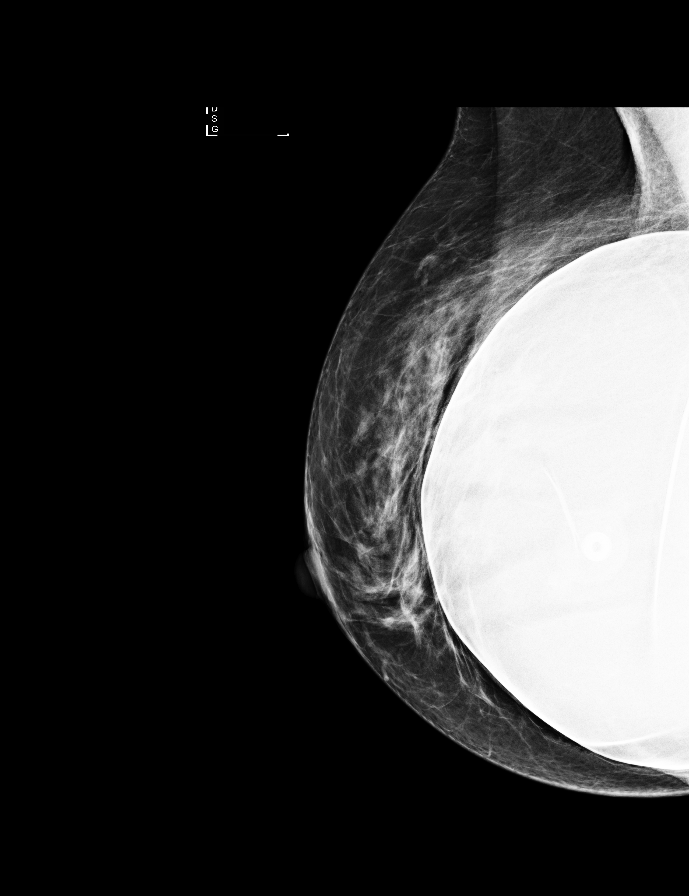

[L MLO synth-2D (1 of 2)]
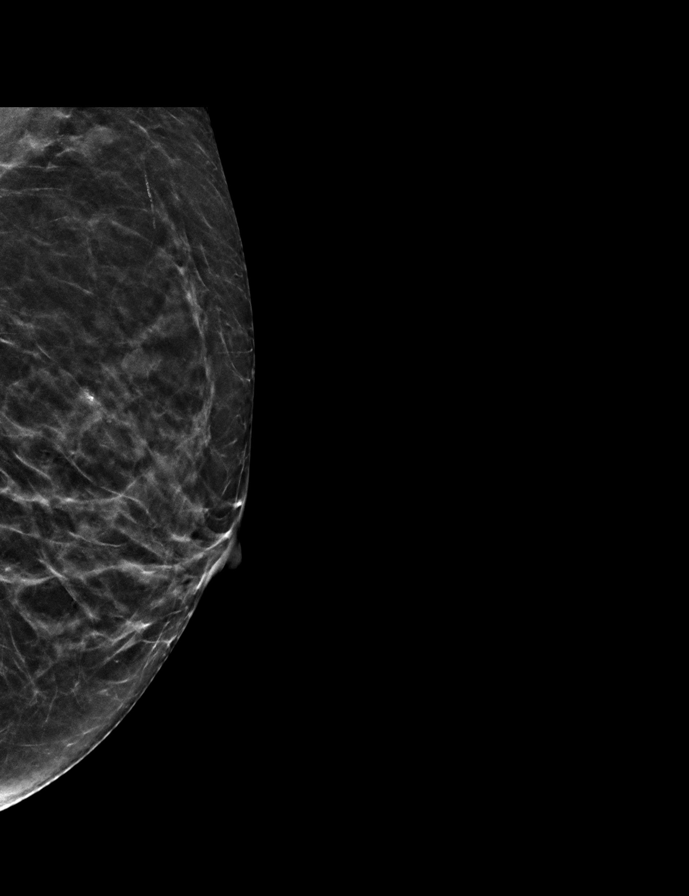

[R CC synth-2D]
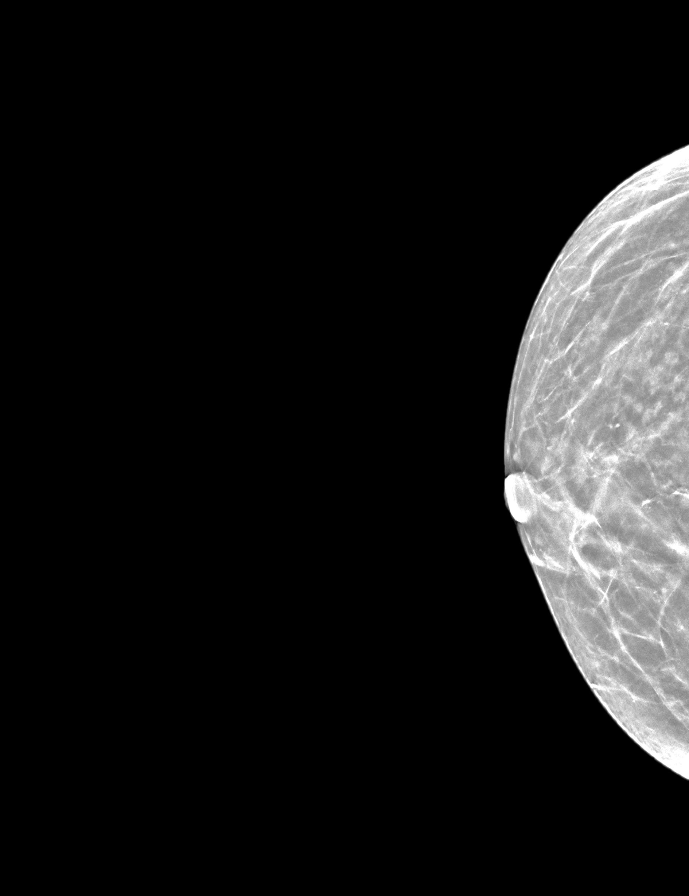

[R MLO synth-2D]
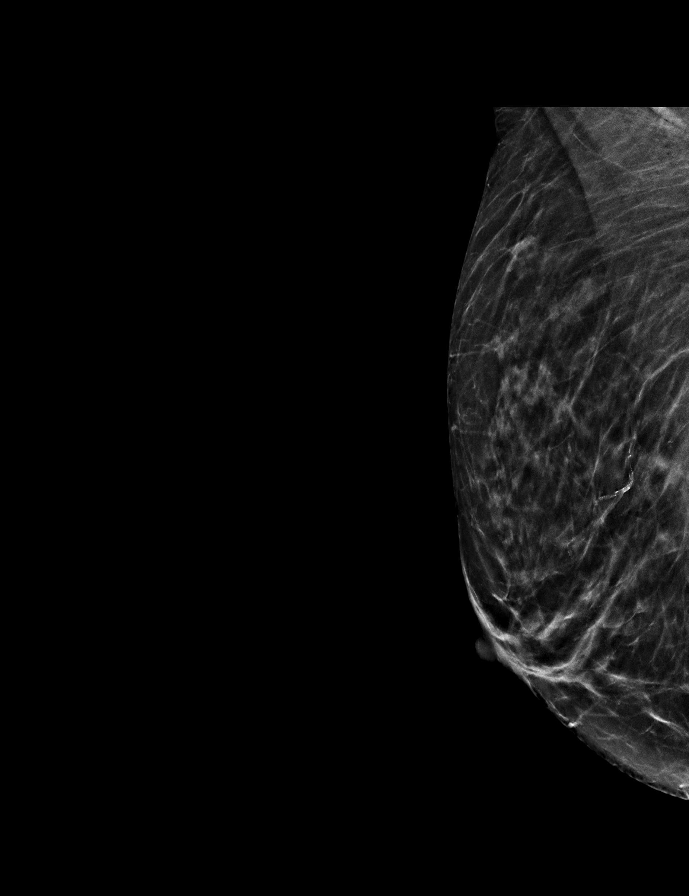

[L CC synth-2D]
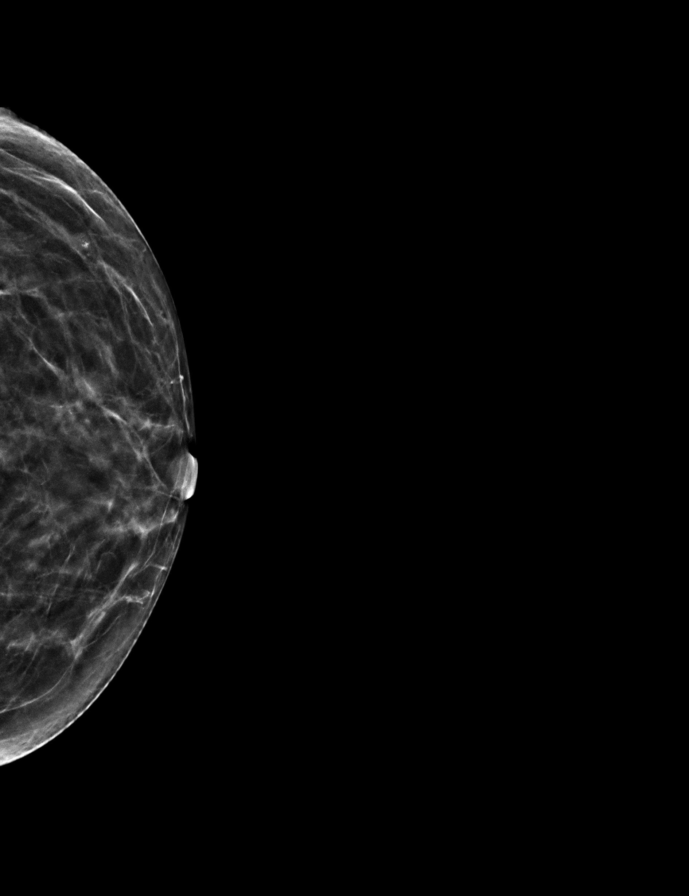

[L MLO synth-2D (2 of 2)]
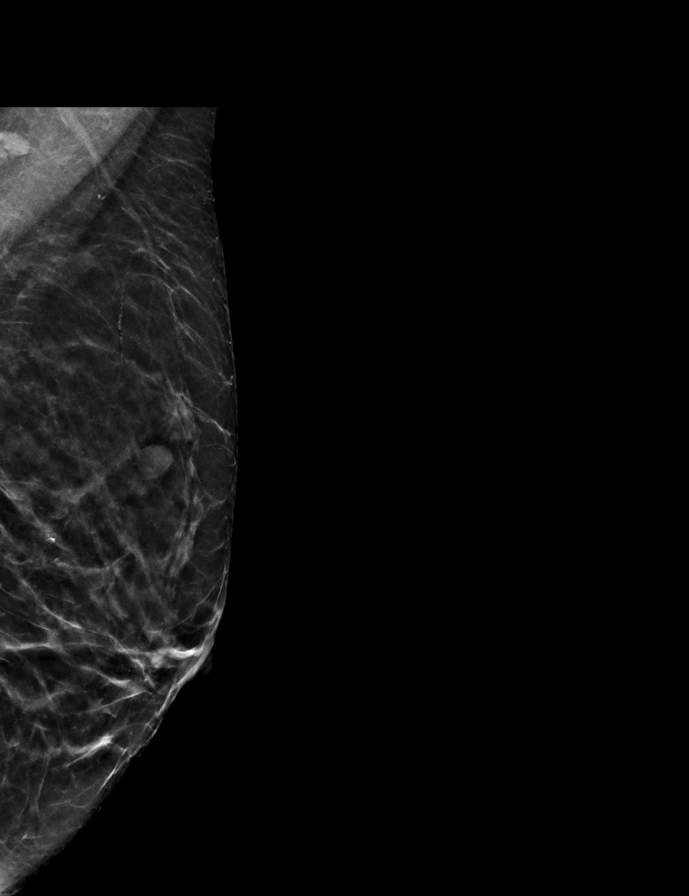

[9 of 34 positions shown; findings below may reference images not displayed]

ACR Breast Density Category b: There are scattered areas of
fibroglandular density.
FINDINGS: There are no findings suspicious for malignancy. Images were
processed with CAD.
IMPRESSION: No mammographic evidence of malignancy. A result letter of this
screening mammogram will be mailed directly to the patient.

RECOMMENDATION:
Screening mammogram in one year. (Code:60-T-8Z4)

BI-RADS CATEGORY  1:  Negative.

## 2022-10-25 ENCOUNTER — Other Ambulatory Visit: Payer: Self-pay

## 2022-10-25 DIAGNOSIS — Z1231 Encounter for screening mammogram for malignant neoplasm of breast: Secondary | ICD-10-CM

## 2023-01-10 ENCOUNTER — Other Ambulatory Visit: Payer: Self-pay

## 2023-01-10 DIAGNOSIS — Z1231 Encounter for screening mammogram for malignant neoplasm of breast: Secondary | ICD-10-CM

## 2023-06-23 ENCOUNTER — Ambulatory Visit
Admission: RE | Admit: 2023-06-23 | Discharge: 2023-06-23 | Disposition: A | Discharge status: Home / Self Care | Payer: Medicare Other | Source: Ambulatory Visit

## 2023-06-23 DIAGNOSIS — Z1231 Encounter for screening mammogram for malignant neoplasm of breast: Secondary | ICD-10-CM | POA: Diagnosis present

## 2023-11-16 ENCOUNTER — Other Ambulatory Visit: Payer: Self-pay

## 2023-11-16 DIAGNOSIS — Z1231 Encounter for screening mammogram for malignant neoplasm of breast: Secondary | ICD-10-CM

## 2023-12-18 ENCOUNTER — Other Ambulatory Visit: Payer: Self-pay | Admitting: Internal Medicine

## 2023-12-18 DIAGNOSIS — E7800 Pure hypercholesterolemia, unspecified: Secondary | ICD-10-CM

## 2024-02-05 ENCOUNTER — Ambulatory Visit
Admission: RE | Admit: 2024-02-05 | Discharge: 2024-02-05 | Disposition: A | Discharge status: Home / Self Care | Payer: Self-pay | Source: Ambulatory Visit | Attending: Internal Medicine | Admitting: Internal Medicine

## 2024-02-05 DIAGNOSIS — E7800 Pure hypercholesterolemia, unspecified: Secondary | ICD-10-CM | POA: Insufficient documentation

## 2024-06-24 ENCOUNTER — Ambulatory Visit
Admission: RE | Admit: 2024-06-24 | Discharge: 2024-06-24 | Disposition: A | Discharge status: Home / Self Care | Source: Ambulatory Visit

## 2024-06-24 DIAGNOSIS — Z1231 Encounter for screening mammogram for malignant neoplasm of breast: Secondary | ICD-10-CM | POA: Diagnosis present
